# Patient Record
Sex: Male | Born: 1985 | Race: White | Hispanic: No | Marital: Single | Smoking: Never smoker
Health system: Southern US, Community
[De-identification: ages and names within clinical notes are randomized; demographics above are authoritative.]

---
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
## 2006-10-28 ENCOUNTER — Encounter: Admission: RE | Admit: 2006-10-28 | Discharge: 2006-10-28 | Payer: Self-pay | Admitting: Family Medicine

## 2008-10-07 IMAGING — CR DG CHEST 2V
2 series · 2 of 2 positions shown · non-contrast
Comparison: none

CLINICAL DATA: Cough.  Wheezing.  Short of breath.
 CHEST ? 2 VIEW:

[w chest pa]
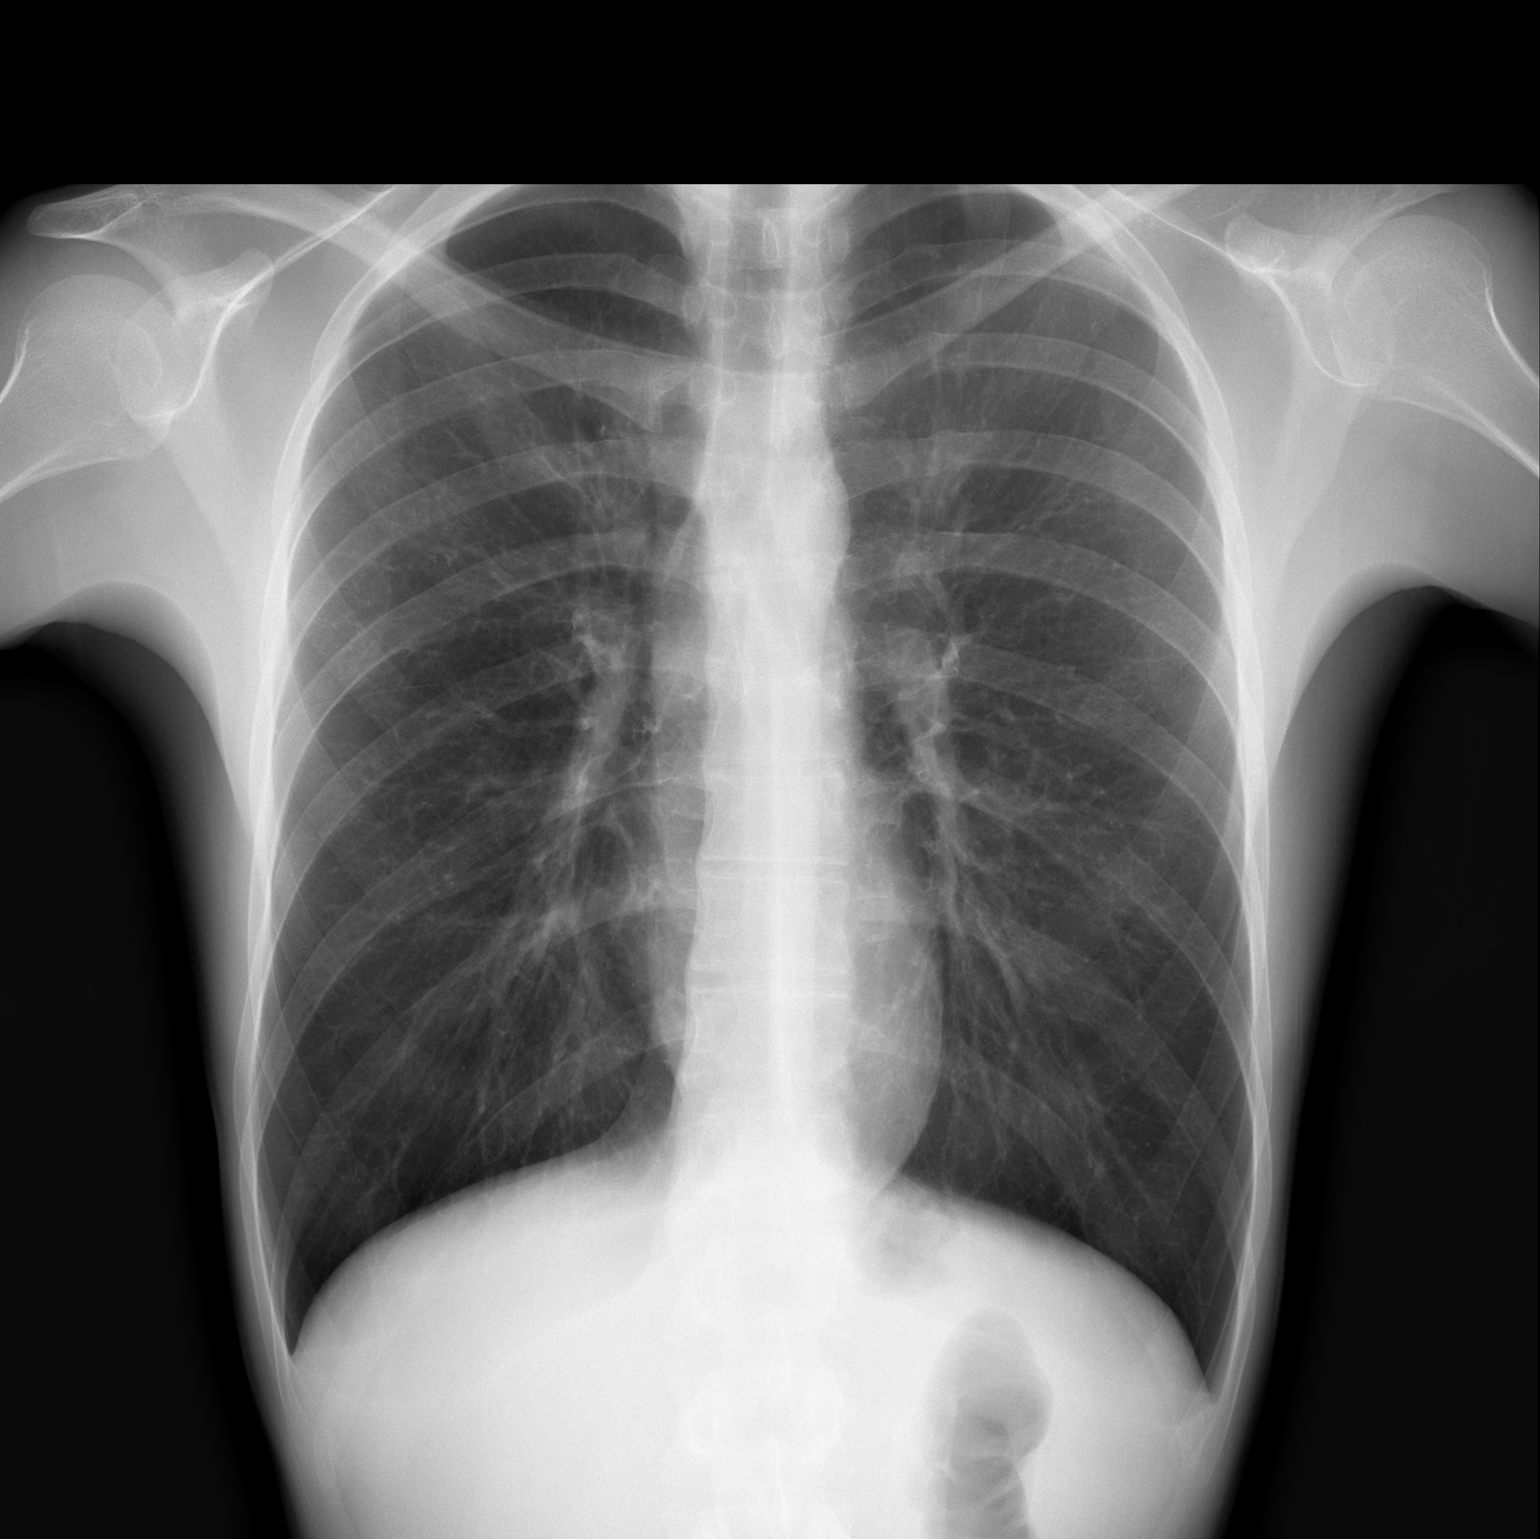

[w chest lat]
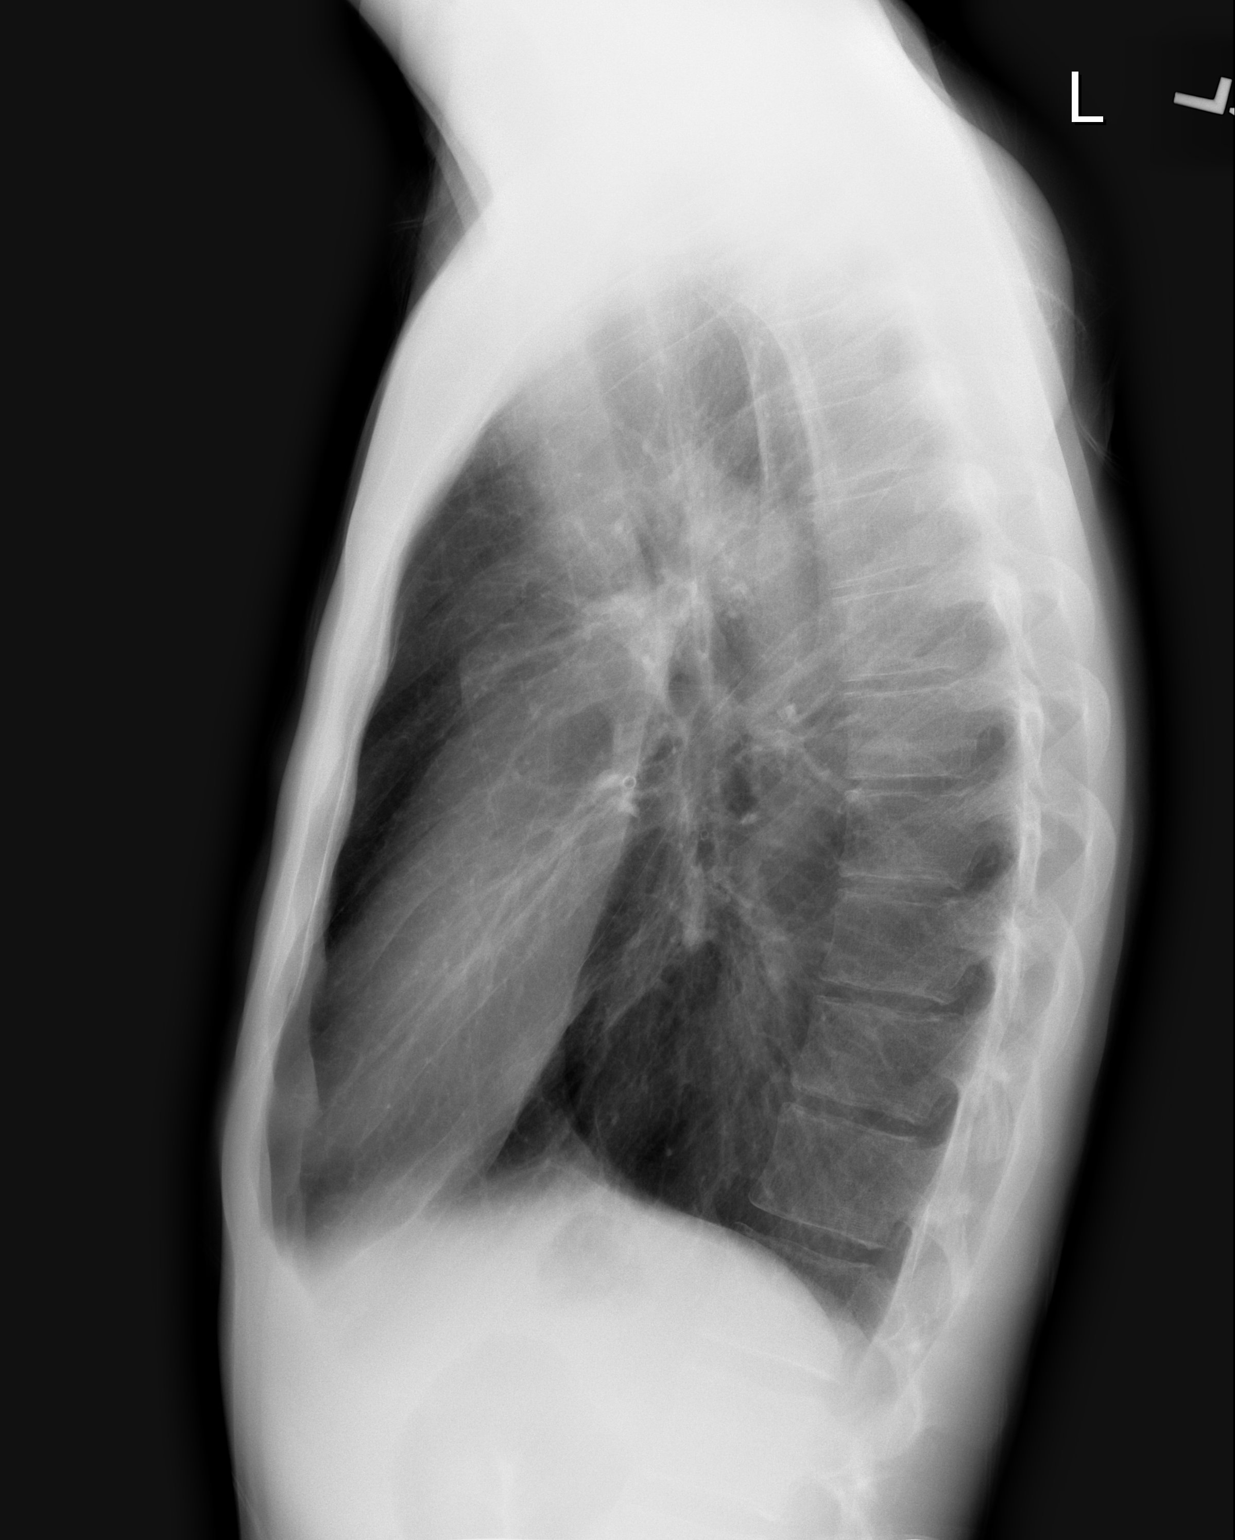

[2 of 2 positions shown; findings below may reference images not displayed]

FINDINGS: Two views of the chest show the lungs to be clear but hyperaerated.  There is peribronchial thickening which may indicate bronchitis.  The heart is within normal limits in size.
IMPRESSION: Peribronchial thickening consistent with bronchitis.  No pneumonia.  Slight hyperaeration.

## 2009-11-09 ENCOUNTER — Emergency Department (HOSPITAL_COMMUNITY): Admission: EM | Admit: 2009-11-09 | Discharge: 2009-11-09 | Payer: Self-pay | Admitting: Family Medicine

## 2010-12-14 LAB — POCT RAPID STREP A (OFFICE): Streptococcus, Group A Screen (Direct): NEGATIVE

## 2015-09-15 ENCOUNTER — Emergency Department (INDEPENDENT_AMBULATORY_CARE_PROVIDER_SITE_OTHER)
Admission: EM | Admit: 2015-09-15 | Discharge: 2015-09-15 | Disposition: A | Discharge status: Home / Self Care | Payer: BLUE CROSS/BLUE SHIELD | Source: Home / Self Care | Attending: Family Medicine | Admitting: Family Medicine

## 2015-09-15 ENCOUNTER — Encounter (HOSPITAL_COMMUNITY): Payer: Self-pay | Admitting: Emergency Medicine

## 2015-09-15 DIAGNOSIS — L03211 Cellulitis of face: Secondary | ICD-10-CM | POA: Diagnosis not present

## 2015-09-15 MED ORDER — DOXYCYCLINE HYCLATE 100 MG PO TABS: 100.0000 mg | ORAL_TABLET | Freq: Two times a day (BID) | ORAL | Status: DC

## 2015-09-15 NOTE — Discharge Instructions (Signed)
It was a pleasure to see you today.   As we discussed, skin infections of the face can progress rapidly and often require IV antibiotics in the hospital.  I am prescribing you an oral antibiotic, DOXYCYCLINE 100mg  tablets, take 1 tablet by mouth twice daily, beginning as soon as possible.   REPORT TO THE EMERGENCY ROOM IF YOU EXPERIENCE FEVERS/CHILLS, PAIN WITH MOVEMENT OF THE EYES, WORSENING OF SWELLING DESPITE TAKING THE ANTIBIOTICS, OR OTHER CONCERNS.   I am glad you have follow up scheduled for tomorrow morning at [facility].  It is imperative that you keep this follow-up appointment.

## 2015-09-15 NOTE — ED Provider Notes (Signed)
CSN: [id number]     Arrival date & time 09/15/15  1744 History   First MD Initiated Contact with Patient 09/15/15 1813     Chief Complaint  Patient presents with  . Cellulitis   (Consider location/radiation/quality/duration/timing/severity/associated sxs/prior Treatment) The history is provided by the patient. No language interpreter was used.  Pt here for complaint of swelling over the left eye, started 3 days ago when he tried to pop a pimple over the bridge of the nose. Soon thereafter noticed swelling along the bridge of the nose, extended along L upper eyelid. This morning was more swollen and difficulty opening the eye due to swelling. Of note, he has had no fevers/chills/sweats, not feeling ill overall.  He was seen at an urgent care on [facility] this morning around 10:30am and given a shot of antibiotic, Rx for Levaquin  daily.  He comes in to our [facility] today because it has not improved.   ROS: No cough, no shortness of breath.  Has history asthma but no recent flares. No ear pain, no sore throat.   History reviewed. No pertinent past medical history. History reviewed. No pertinent past surgical history. No family history on file. Social History  Substance Use Topics  . Smoking status: Never Smoker   . Smokeless tobacco: None  . Alcohol Use: Yes    Review of Systems  Constitutional: Negative for fever, chills, diaphoresis and fatigue.  HENT: Negative for congestion.   Respiratory: Negative for cough.   All other systems reviewed and are negative.   Allergies  Sulfa antibiotics  Home Medications   Prior to Admission medications   Medication Sig Start Date End Date Taking? Authorizing Provider  ALPRAZolam Prudy Feeler) 1 MG tablet Take 1 mg by mouth at bedtime as needed for anxiety.   Yes Historical Provider, MD  amLODipine (NORVASC) 10 MG tablet Take 10 mg by mouth daily.   Yes Historical Provider, MD  minocycline (DYNACIN) 100 MG tablet Take 100 mg by mouth 2  (two) times daily.   Yes Historical Provider, MD  pravastatin (PRAVACHOL) 20 MG tablet Take 20 mg by mouth daily.   Yes Historical Provider, MD  albuterol (PROVENTIL HFA;VENTOLIN HFA) 108 (90 BASE) MCG/ACT inhaler Inhale into the lungs every 6 (six) hours as needed for wheezing or shortness of breath.    Historical Provider, MD  doxycycline (VIBRA-TABS) 100 MG tablet Take 1 tablet (100 mg total) by mouth 2 (two) times daily. 09/15/15   Barbaraann Barthel, MD   Meds Ordered and Administered this Visit  Medications - No data to display  BP 152/91 mmHg  Pulse 79  Temp(Src) 98.5 F (36.9 C) (Oral)  SpO2 100% No data found.   Physical Exam  Constitutional: He appears well-developed and well-nourished. No distress.  HENT:  Right Ear: External ear normal.  Left Ear: External ear normal.  Mouth/Throat: Oropharynx is clear and moist. No oropharyngeal exudate.  Mild erythema over bridge of nose; marked swelling of upper eyelid on L eye. Able to open eyelid manually, normal tracking without pain. PERRL. EOMI.   Eyes: Conjunctivae and EOM are normal. Pupils are equal, round, and reactive to light.  Marked swelling and erythema of left eyelid. Able to track on EOM testing when L eyelid is held open by examiner.   PERRL.   Neck: Normal range of motion. Neck supple.  Cardiovascular: Normal rate and regular rhythm.   Pulmonary/Chest: Effort normal and breath sounds normal. No respiratory distress. He has no wheezes. He has  no rales. He exhibits no tenderness.  Lymphadenopathy:    He has no cervical adenopathy.  Skin: He is not diaphoretic.    ED Course  Procedures (including critical care time)  Labs Review Labs Reviewed - No data to display  Imaging Review No results found.   Visual Acuity Review  Right Eye Distance: unable to obtain due to swelling Left Eye Distance: 20/40 w/o corrections Bilateral Distance:    Right Eye Near:   Left Eye Near:    Bilateral Near:         MDM    1. Cellulitis of face    Patient with cellulitis of bridge of nose and L eyelid. No systemic signs of infection, not affecting EOM movement.  Discussion with patient of the seriousness of the infection, often indication for inpatient management with IV antibiotics.  Patient has follow up appointment with his primary doctor tomorrow [facility] at [facility] and plans to keep that appointment. Will give trial of Doxycycline 100mg  tablets po BID tonight with strict instructions to report to ED if any worsening or failure to improve by morning. He voices understanding of these instructions.     Barbaraann BarthelJames O Dontavian Marchi, MD 09/15/15 [phone]

## 2015-09-15 NOTE — ED Notes (Signed)
C/o cellulitis of left eye onset 2 days Reports he popped a pimple and swelling began afterwards Sx today include swelling, redness, and mild pain Unable to open left eye due to swelling A&O x4... No acute distress.

## 2015-09-16 ENCOUNTER — Telehealth: Payer: Self-pay | Admitting: Family Medicine

## 2015-09-16 NOTE — Telephone Encounter (Signed)
I called patient to inquire about his welfare, seen yesterday in [facility].  He reports no improvement or worsening of the redness/swelling over his left eye. He did get the doxycycline yesterday evening and has taken it.  I recommended that he proceed to the Emergency Department for evaluation and likely IV antibiotics.  He voiced understanding of this recommendation.   Paula ComptonJames Mahaley Schwering, MD

## 2016-01-17 ENCOUNTER — Emergency Department (HOSPITAL_COMMUNITY)
Admission: EM | Admit: 2016-01-17 | Discharge: 2016-01-17 | Disposition: A | Discharge status: Home / Self Care | Payer: BLUE CROSS/BLUE SHIELD | Attending: Emergency Medicine | Admitting: Emergency Medicine

## 2016-01-17 ENCOUNTER — Encounter (HOSPITAL_COMMUNITY): Payer: Self-pay | Admitting: *Deleted

## 2016-01-17 ENCOUNTER — Emergency Department (HOSPITAL_COMMUNITY): Payer: BLUE CROSS/BLUE SHIELD

## 2016-01-17 DIAGNOSIS — Z79899 Other long term (current) drug therapy: Secondary | ICD-10-CM | POA: Insufficient documentation

## 2016-01-17 DIAGNOSIS — Z792 Long term (current) use of antibiotics: Secondary | ICD-10-CM | POA: Insufficient documentation

## 2016-01-17 DIAGNOSIS — S92532A Displaced fracture of distal phalanx of left lesser toe(s), initial encounter for closed fracture: Secondary | ICD-10-CM | POA: Insufficient documentation

## 2016-01-17 DIAGNOSIS — W2209XA Striking against other stationary object, initial encounter: Secondary | ICD-10-CM | POA: Insufficient documentation

## 2016-01-17 DIAGNOSIS — S90122A Contusion of left lesser toe(s) without damage to nail, initial encounter: Secondary | ICD-10-CM | POA: Insufficient documentation

## 2016-01-17 DIAGNOSIS — Y9289 Other specified places as the place of occurrence of the external cause: Secondary | ICD-10-CM | POA: Insufficient documentation

## 2016-01-17 DIAGNOSIS — S92912A Unspecified fracture of left toe(s), initial encounter for closed fracture: Secondary | ICD-10-CM

## 2016-01-17 DIAGNOSIS — Y998 Other external cause status: Secondary | ICD-10-CM | POA: Diagnosis not present

## 2016-01-17 DIAGNOSIS — Y9302 Activity, running: Secondary | ICD-10-CM | POA: Diagnosis not present

## 2016-01-17 DIAGNOSIS — S92422A Displaced fracture of distal phalanx of left great toe, initial encounter for closed fracture: Secondary | ICD-10-CM | POA: Diagnosis not present

## 2016-01-17 DIAGNOSIS — S99922A Unspecified injury of left foot, initial encounter: Secondary | ICD-10-CM | POA: Diagnosis present

## 2016-01-17 MED ORDER — HYDROCODONE-ACETAMINOPHEN 5-325 MG PO TABS: 2.0000 | ORAL_TABLET | ORAL | Status: DC | PRN

## 2016-01-17 MED ORDER — IBUPROFEN 400 MG PO TABS
800.0000 mg | ORAL_TABLET | Freq: Once | ORAL | Status: AC
  Administered 2016-01-17: 800 mg via ORAL
  Filled 2016-01-17: qty 2

## 2016-01-17 MED ORDER — IBUPROFEN 800 MG PO TABS: 800.0000 mg | ORAL_TABLET | Freq: Three times a day (TID) | ORAL | Status: DC

## 2016-01-17 NOTE — ED Notes (Signed)
Ortho paged. 

## 2016-01-17 NOTE — ED Provider Notes (Signed)
CSN: [id number]     Arrival date & time 01/17/16  2147 History  By signing my name below, I, Iona BeardChristian Pulliam, attest that this documentation has been prepared under the direction and in the presence of Melburn HakeNicole Sheriece Jefcoat, PA-C  Electronically Signed: Iona Beardhristian Pulliam, ED Scribe 01/17/2016 at 11:16 PM.  Chief Complaint  Patient presents with  . Toe Injury    The history is provided by the patient. No language interpreter was used.   HPI Comments: Mathew Hunt is a 30 y.o. male who presents to the Emergency Department complaining of sudden onset, left 3rd toe pain, onset around 4:30 PM today after running his foot into a door. Pt reports associated ecchymosis, swelling, and mild left foot pain. No other associated symptoms noted. Pt took naproxen at home and applied ice with minimal relief to symptoms. No other worsening or alleviating factors noted. Pt denies numbness, tingling, weakness, or any other pertinent symptoms.   History reviewed. No pertinent past medical history. History reviewed. No pertinent past surgical history. No family history on file. Social History  Substance Use Topics  . Smoking status: Never Smoker   . Smokeless tobacco: None  . Alcohol Use: Yes    Review of Systems  Musculoskeletal: Positive for arthralgias.  Skin: Positive for color change.  Neurological: Negative for weakness and numbness.    Allergies  Sulfa antibiotics  Home Medications   Prior to Admission medications   Medication Sig Start Date End Date Taking? Authorizing Provider  albuterol (PROVENTIL HFA;VENTOLIN HFA) 108 (90 BASE) MCG/ACT inhaler Inhale into the lungs every 6 (six) hours as needed for wheezing or shortness of breath.   Yes Historical Provider, MD  ALPRAZolam Prudy Feeler(XANAX) 1 MG tablet Take 1 mg by mouth at bedtime as needed for anxiety.   Yes Historical Provider, MD  amLODipine (NORVASC) 10 MG tablet Take 10 mg by mouth daily.   Yes Historical Provider, MD  minocycline (DYNACIN)  100 MG tablet Take 100 mg by mouth 2 (two) times daily.   Yes Historical Provider, MD  pravastatin (PRAVACHOL) 20 MG tablet Take 20 mg by mouth daily.   Yes Historical Provider, MD  doxycycline (VIBRA-TABS) 100 MG tablet Take 1 tablet (100 mg total) by mouth 2 (two) times daily. Patient not taking: Reported on 01/17/2016 09/15/15   Barbaraann BarthelJames O Breen, MD  HYDROcodone-acetaminophen (NORCO/VICODIN) 5-325 MG tablet Take 2 tablets by mouth every 4 (four) hours as needed. 01/17/16   Barrett HenleNicole Elizabeth Sparrow Sanzo, PA-C  ibuprofen (ADVIL,MOTRIN) 800 MG tablet Take 1 tablet (800 mg total) by mouth 3 (three) times daily. 01/17/16   Satira SarkNicole Elizabeth Tarance Balan, PA-C   Pulse 81  Temp(Src) 98.1 F (36.7 C)  Resp 16  Ht 5\' 10"  (1.778 m)  Wt 163 lb 8 oz (74.163 kg)  BMI 23.46 kg/m2  SpO2 99% Physical Exam  Constitutional: He is oriented to person, place, and time. He appears well-developed and well-nourished.  HENT:  Head: Normocephalic and atraumatic.  Eyes: Conjunctivae and EOM are normal. Right eye exhibits no discharge. Left eye exhibits no discharge. No scleral icterus.  Neck: Normal range of motion. Neck supple.  Cardiovascular: Normal rate.   Pulmonary/Chest: Effort normal.  Abdominal: Soft. He exhibits no distension.  Musculoskeletal:  Mild swelling and ecchymosis to volar aspect of left 3rd toe at the distal and middle phalanx. No subungual hematoma. Full active ROM of left toes, foot, and ankle.  2+ DP pulses. Sensation grossly intact. Capillary refill less than 2 seconds.   Neurological: He  is alert and oriented to person, place, and time.  Skin: Skin is warm and dry.  Nursing note and vitals reviewed.   ED Course  Procedures (including critical care time) DIAGNOSTIC STUDIES: Oxygen Saturation is 99% on RA, normal by my interpretation.    COORDINATION OF CARE: 10:26 PM-Discussed treatment plan which includes DG toe 3rd left with pt at bedside and pt agreed to plan.   Labs Review Labs Reviewed -  No data to display  Imaging Review Dg Toe 3rd Left  01/17/2016  CLINICAL DATA:  Pain and swelling third tire were after toe obstructive door right ear earlier today. EXAM: LEFT THIRD TOE COMPARISON:  None. FINDINGS: Minimally displaced fracture distal phalanx extending to the distal interphalangeal joint. This is a chip fracture from the dorsal surface. Remainder of the third toe is intact. IMPRESSION: Minimally displaced intra-articular distal phalanx fracture. Electronically Signed   By: Rubye Oaks M.D.   On: 01/17/2016 22:26   I have personally reviewed and evaluated these images as part of my medical decision-making.   EKG Interpretation None      MDM   Final diagnoses:  Closed fracture of distal phalanx of toe of left foot   Patient presents with left third toe pain, swelling and bruising after stubbing his toe against a door prior to arrival. Mild relief of symptoms with naproxen and ice at home. VSS. Exam revealed mild swelling and ecchymosis to volar aspect of left third toe, no subungual hematoma. Left foot neurovascularly intact. Patient given ibuprofen and ice in the ED. Left third toe x-ray revealed minimally displaced intra-articular distal phalanx fracture. Buddy tape applied to patient's foot in the ED and patient given postop shoe. Discussed results and plan for discharge with patient. Patient discharged home with pain meds and discussed symptomatic treatment. Patient given orthopedic contact information for outpatient follow-up.  I personally performed the services described in this documentation, which was scribed in my presence. The recorded information has been reviewed and is accurate.   Satira Sark [place], [place] 01/17/16 2317  Pricilla Loveless, MD 01/19/16 [phone]

## 2016-01-17 NOTE — Discharge Instructions (Signed)
Take your medications as prescribed as needed for pain relief. I recommend eating prior to taking ibuprofen to prevent gastrointestinal side effects. Recommend continuing to rest, elevate and ice her toe for 15-20 minutes 3-4 times daily to help with pain and swelling. Follow-up with the orthopedic office listed above in the next 1-2 weeks. Return to the emergency department if symptoms worsen or new onset of fever, worsening swelling/bruising, numbness, tingling, weakness, redness, warmth, drainage.

## 2016-01-17 NOTE — ED Notes (Signed)
Patient able to ambulate independently  

## 2016-01-17 NOTE — ED Notes (Signed)
Pt went to xray.  Will be returned to room.

## 2016-01-17 NOTE — ED Notes (Signed)
The pt is c/o rt middle toe pain after he ran into an object earlier today  Bruised and swollen and painful

## 2016-01-23 DIAGNOSIS — S92532A Displaced fracture of distal phalanx of left lesser toe(s), initial encounter for closed fracture: Secondary | ICD-10-CM | POA: Diagnosis not present

## 2016-04-27 DIAGNOSIS — I10 Essential (primary) hypertension: Secondary | ICD-10-CM | POA: Diagnosis not present

## 2016-04-27 DIAGNOSIS — E785 Hyperlipidemia, unspecified: Secondary | ICD-10-CM | POA: Diagnosis not present

## 2016-04-27 DIAGNOSIS — J45909 Unspecified asthma, uncomplicated: Secondary | ICD-10-CM | POA: Diagnosis not present

## 2016-04-27 DIAGNOSIS — F410 Panic disorder [episodic paroxysmal anxiety] without agoraphobia: Secondary | ICD-10-CM | POA: Diagnosis not present

## 2016-10-18 DIAGNOSIS — Z23 Encounter for immunization: Secondary | ICD-10-CM | POA: Diagnosis not present

## 2016-10-29 DIAGNOSIS — Z0000 Encounter for general adult medical examination without abnormal findings: Secondary | ICD-10-CM | POA: Diagnosis not present

## 2016-11-02 DIAGNOSIS — E785 Hyperlipidemia, unspecified: Secondary | ICD-10-CM | POA: Diagnosis not present

## 2017-01-21 DIAGNOSIS — Z3009 Encounter for other general counseling and advice on contraception: Secondary | ICD-10-CM | POA: Diagnosis not present

## 2017-03-19 DIAGNOSIS — Z3009 Encounter for other general counseling and advice on contraception: Secondary | ICD-10-CM | POA: Diagnosis not present

## 2017-04-15 DIAGNOSIS — Z302 Encounter for sterilization: Secondary | ICD-10-CM | POA: Diagnosis not present

## 2017-04-16 DIAGNOSIS — N50811 Right testicular pain: Secondary | ICD-10-CM | POA: Diagnosis not present

## 2017-04-22 DIAGNOSIS — N50811 Right testicular pain: Secondary | ICD-10-CM | POA: Diagnosis not present

## 2017-05-06 DIAGNOSIS — T148XXA Other injury of unspecified body region, initial encounter: Secondary | ICD-10-CM | POA: Diagnosis not present

## 2017-07-29 DIAGNOSIS — Z23 Encounter for immunization: Secondary | ICD-10-CM | POA: Diagnosis not present

## 2017-11-15 DIAGNOSIS — Z7189 Other specified counseling: Secondary | ICD-10-CM | POA: Diagnosis not present

## 2017-11-15 DIAGNOSIS — J45909 Unspecified asthma, uncomplicated: Secondary | ICD-10-CM | POA: Diagnosis not present

## 2017-11-15 DIAGNOSIS — Z23 Encounter for immunization: Secondary | ICD-10-CM | POA: Diagnosis not present

## 2017-12-27 IMAGING — DX DG TOE 3RD 2+V*L*
3 series · 3 of 3 positions shown · non-contrast
Comparison: None.

CLINICAL DATA: Pain and swelling third tire were after toe
obstructive door right ear earlier today.

EXAM:
LEFT THIRD TOE

[toe ap]
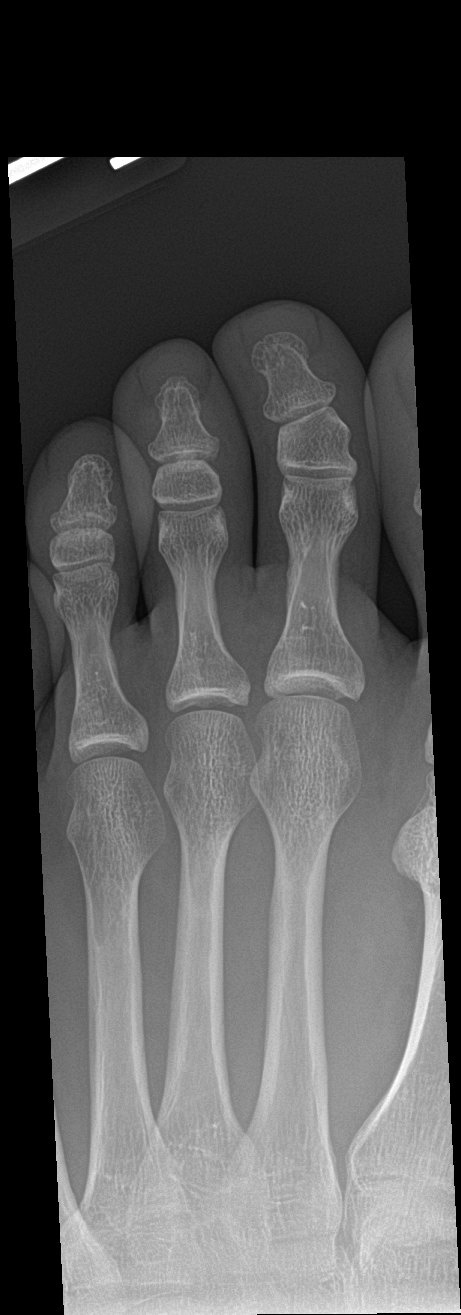

[toe obl]
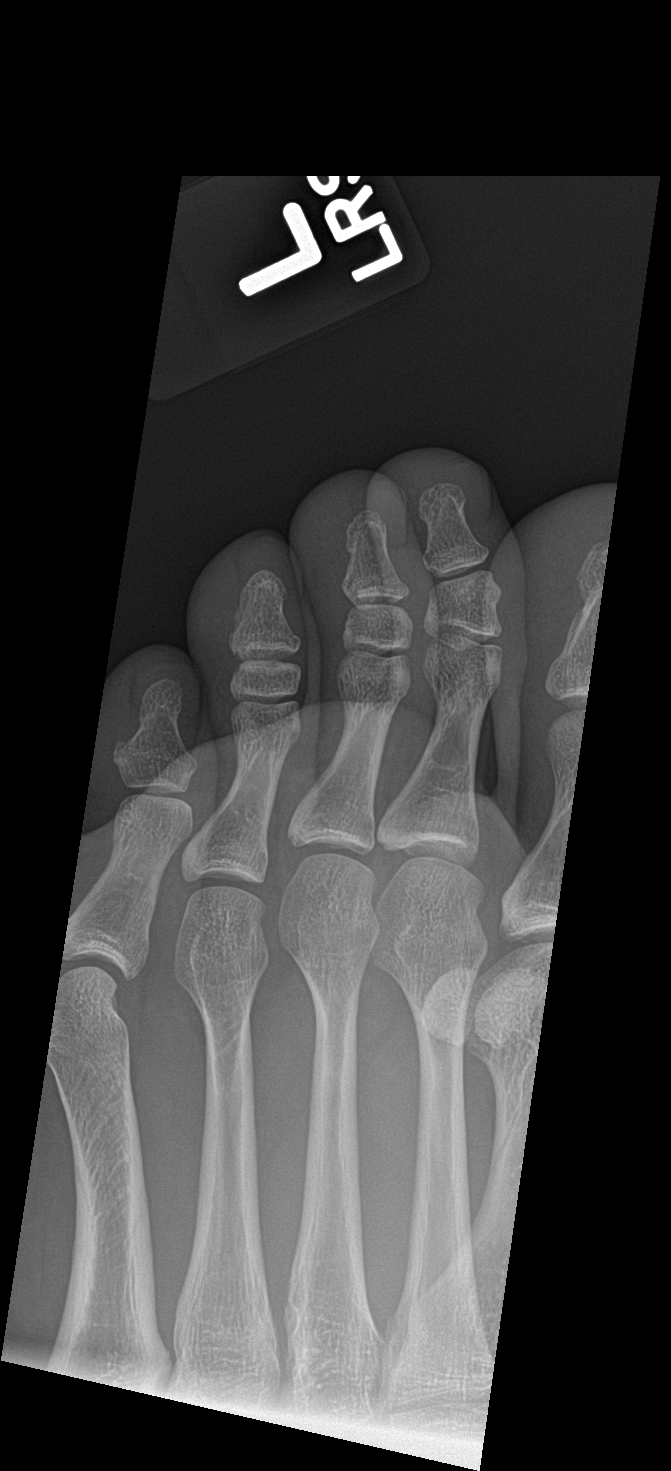

[toe lat]
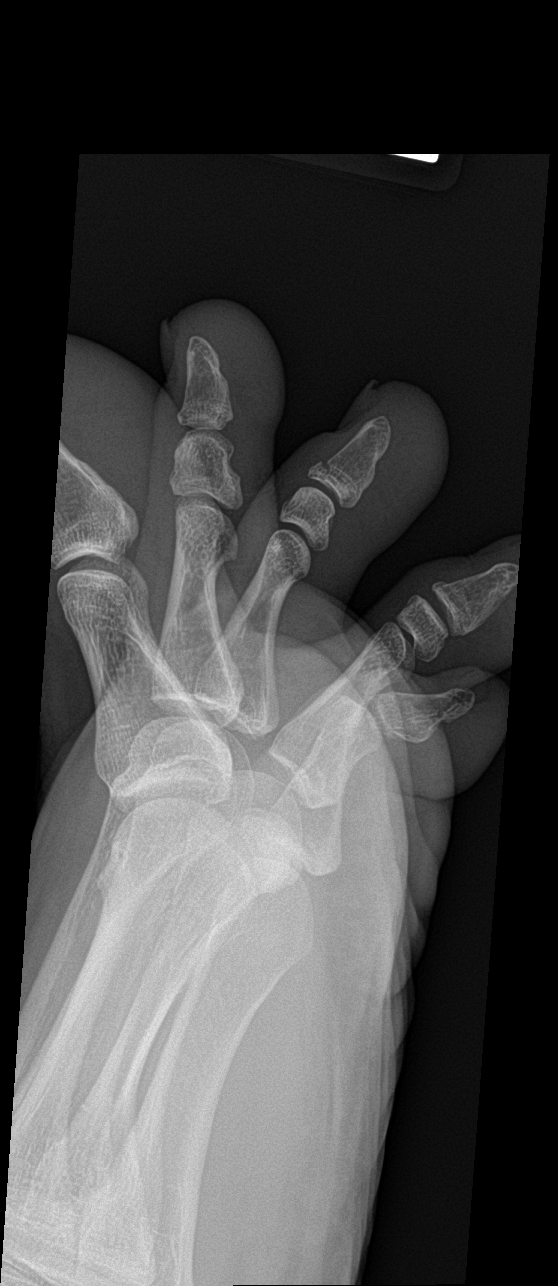

[3 of 3 positions shown; findings below may reference images not displayed]

FINDINGS: Minimally displaced fracture distal phalanx extending to the distal
interphalangeal joint. This is a chip fracture from the dorsal
surface. Remainder of the third toe is intact.
IMPRESSION: Minimally displaced intra-articular distal phalanx fracture.

## 2018-05-19 DIAGNOSIS — Z23 Encounter for immunization: Secondary | ICD-10-CM | POA: Diagnosis not present

## 2018-07-13 DIAGNOSIS — L03116 Cellulitis of left lower limb: Secondary | ICD-10-CM | POA: Diagnosis not present

## 2018-07-15 DIAGNOSIS — Z23 Encounter for immunization: Secondary | ICD-10-CM | POA: Diagnosis not present

## 2018-07-15 DIAGNOSIS — E785 Hyperlipidemia, unspecified: Secondary | ICD-10-CM | POA: Diagnosis not present

## 2018-07-15 DIAGNOSIS — I10 Essential (primary) hypertension: Secondary | ICD-10-CM | POA: Diagnosis not present

## 2018-08-29 DIAGNOSIS — I10 Essential (primary) hypertension: Secondary | ICD-10-CM | POA: Diagnosis not present

## 2018-08-29 DIAGNOSIS — R600 Localized edema: Secondary | ICD-10-CM | POA: Diagnosis not present

## 2018-09-12 DIAGNOSIS — I10 Essential (primary) hypertension: Secondary | ICD-10-CM | POA: Diagnosis not present

## 2018-11-19 DIAGNOSIS — J111 Influenza due to unidentified influenza virus with other respiratory manifestations: Secondary | ICD-10-CM | POA: Diagnosis not present

## 2019-01-14 DIAGNOSIS — Z0000 Encounter for general adult medical examination without abnormal findings: Secondary | ICD-10-CM | POA: Diagnosis not present

## 2019-01-23 DIAGNOSIS — F331 Major depressive disorder, recurrent, moderate: Secondary | ICD-10-CM | POA: Diagnosis not present

## 2019-05-04 ENCOUNTER — Other Ambulatory Visit: Payer: Self-pay

## 2019-05-04 DIAGNOSIS — Z20822 Contact with and (suspected) exposure to covid-19: Secondary | ICD-10-CM

## 2019-05-05 LAB — NOVEL CORONAVIRUS, NAA: SARS-CoV-2, NAA: NOT DETECTED

## 2019-05-11 ENCOUNTER — Other Ambulatory Visit: Payer: Self-pay

## 2019-05-11 DIAGNOSIS — Z20822 Contact with and (suspected) exposure to covid-19: Secondary | ICD-10-CM

## 2019-05-13 LAB — NOVEL CORONAVIRUS, NAA: SARS-CoV-2, NAA: NOT DETECTED

## 2019-07-16 ENCOUNTER — Other Ambulatory Visit: Payer: Self-pay

## 2019-07-16 DIAGNOSIS — Z20822 Contact with and (suspected) exposure to covid-19: Secondary | ICD-10-CM

## 2019-07-18 LAB — NOVEL CORONAVIRUS, NAA: SARS-CoV-2, NAA: NOT DETECTED

## 2019-07-20 ENCOUNTER — Other Ambulatory Visit: Payer: Self-pay

## 2019-07-20 DIAGNOSIS — Z20822 Contact with and (suspected) exposure to covid-19: Secondary | ICD-10-CM

## 2019-07-21 LAB — NOVEL CORONAVIRUS, NAA: SARS-CoV-2, NAA: NOT DETECTED

## 2019-07-27 ENCOUNTER — Other Ambulatory Visit: Payer: Self-pay

## 2019-07-27 DIAGNOSIS — Z20822 Contact with and (suspected) exposure to covid-19: Secondary | ICD-10-CM

## 2019-07-27 DIAGNOSIS — J019 Acute sinusitis, unspecified: Secondary | ICD-10-CM | POA: Diagnosis not present

## 2019-07-28 LAB — NOVEL CORONAVIRUS, NAA: SARS-CoV-2, NAA: DETECTED — AB

## 2019-08-26 DIAGNOSIS — F410 Panic disorder [episodic paroxysmal anxiety] without agoraphobia: Secondary | ICD-10-CM | POA: Diagnosis not present

## 2019-08-26 DIAGNOSIS — E785 Hyperlipidemia, unspecified: Secondary | ICD-10-CM | POA: Diagnosis not present

## 2019-08-26 DIAGNOSIS — I10 Essential (primary) hypertension: Secondary | ICD-10-CM | POA: Diagnosis not present

## 2020-09-08 DIAGNOSIS — F331 Major depressive disorder, recurrent, moderate: Secondary | ICD-10-CM | POA: Diagnosis not present

## 2020-09-08 DIAGNOSIS — J45909 Unspecified asthma, uncomplicated: Secondary | ICD-10-CM | POA: Diagnosis not present

## 2021-07-25 DIAGNOSIS — U071 COVID-19: Secondary | ICD-10-CM | POA: Diagnosis not present

## 2021-09-22 DIAGNOSIS — F410 Panic disorder [episodic paroxysmal anxiety] without agoraphobia: Secondary | ICD-10-CM | POA: Diagnosis not present

## 2021-09-22 DIAGNOSIS — Z23 Encounter for immunization: Secondary | ICD-10-CM | POA: Diagnosis not present

## 2021-09-22 DIAGNOSIS — L709 Acne, unspecified: Secondary | ICD-10-CM | POA: Diagnosis not present

## 2021-09-22 DIAGNOSIS — I10 Essential (primary) hypertension: Secondary | ICD-10-CM | POA: Diagnosis not present

## 2021-09-22 DIAGNOSIS — J45909 Unspecified asthma, uncomplicated: Secondary | ICD-10-CM | POA: Diagnosis not present

## 2021-10-06 DIAGNOSIS — I10 Essential (primary) hypertension: Secondary | ICD-10-CM | POA: Diagnosis not present

## 2022-04-05 DIAGNOSIS — I10 Essential (primary) hypertension: Secondary | ICD-10-CM | POA: Diagnosis not present

## 2022-04-05 DIAGNOSIS — J45909 Unspecified asthma, uncomplicated: Secondary | ICD-10-CM | POA: Diagnosis not present

## 2022-04-05 DIAGNOSIS — E785 Hyperlipidemia, unspecified: Secondary | ICD-10-CM | POA: Diagnosis not present

## 2022-06-07 DIAGNOSIS — E7849 Other hyperlipidemia: Secondary | ICD-10-CM | POA: Diagnosis not present

## 2022-06-15 DIAGNOSIS — F321 Major depressive disorder, single episode, moderate: Secondary | ICD-10-CM | POA: Diagnosis not present

## 2022-06-19 DIAGNOSIS — F321 Major depressive disorder, single episode, moderate: Secondary | ICD-10-CM | POA: Diagnosis not present

## 2022-06-26 DIAGNOSIS — F321 Major depressive disorder, single episode, moderate: Secondary | ICD-10-CM | POA: Diagnosis not present

## 2022-07-02 DIAGNOSIS — F321 Major depressive disorder, single episode, moderate: Secondary | ICD-10-CM | POA: Diagnosis not present

## 2022-07-10 DIAGNOSIS — F321 Major depressive disorder, single episode, moderate: Secondary | ICD-10-CM | POA: Diagnosis not present

## 2022-07-17 DIAGNOSIS — F321 Major depressive disorder, single episode, moderate: Secondary | ICD-10-CM | POA: Diagnosis not present

## 2022-07-31 DIAGNOSIS — F321 Major depressive disorder, single episode, moderate: Secondary | ICD-10-CM | POA: Diagnosis not present

## 2022-08-07 DIAGNOSIS — F321 Major depressive disorder, single episode, moderate: Secondary | ICD-10-CM | POA: Diagnosis not present

## 2022-08-14 DIAGNOSIS — F321 Major depressive disorder, single episode, moderate: Secondary | ICD-10-CM | POA: Diagnosis not present

## 2022-08-21 DIAGNOSIS — F321 Major depressive disorder, single episode, moderate: Secondary | ICD-10-CM | POA: Diagnosis not present

## 2022-09-11 DIAGNOSIS — F321 Major depressive disorder, single episode, moderate: Secondary | ICD-10-CM | POA: Diagnosis not present

## 2022-09-27 DIAGNOSIS — F321 Major depressive disorder, single episode, moderate: Secondary | ICD-10-CM | POA: Diagnosis not present

## 2022-10-04 DIAGNOSIS — I10 Essential (primary) hypertension: Secondary | ICD-10-CM | POA: Diagnosis not present

## 2022-10-04 DIAGNOSIS — Z23 Encounter for immunization: Secondary | ICD-10-CM | POA: Diagnosis not present

## 2022-10-04 DIAGNOSIS — Z0000 Encounter for general adult medical examination without abnormal findings: Secondary | ICD-10-CM | POA: Diagnosis not present

## 2022-10-04 DIAGNOSIS — E785 Hyperlipidemia, unspecified: Secondary | ICD-10-CM | POA: Diagnosis not present

## 2022-10-16 DIAGNOSIS — F321 Major depressive disorder, single episode, moderate: Secondary | ICD-10-CM | POA: Diagnosis not present

## 2022-10-25 DIAGNOSIS — F321 Major depressive disorder, single episode, moderate: Secondary | ICD-10-CM | POA: Diagnosis not present

## 2022-11-06 DIAGNOSIS — F321 Major depressive disorder, single episode, moderate: Secondary | ICD-10-CM | POA: Diagnosis not present

## 2022-11-07 DIAGNOSIS — R17 Unspecified jaundice: Secondary | ICD-10-CM | POA: Diagnosis not present

## 2022-11-07 DIAGNOSIS — I10 Essential (primary) hypertension: Secondary | ICD-10-CM | POA: Diagnosis not present

## 2022-11-13 DIAGNOSIS — F321 Major depressive disorder, single episode, moderate: Secondary | ICD-10-CM | POA: Diagnosis not present

## 2022-11-29 DIAGNOSIS — F321 Major depressive disorder, single episode, moderate: Secondary | ICD-10-CM | POA: Diagnosis not present

## 2022-12-05 DIAGNOSIS — F321 Major depressive disorder, single episode, moderate: Secondary | ICD-10-CM | POA: Diagnosis not present

## 2022-12-14 DIAGNOSIS — F321 Major depressive disorder, single episode, moderate: Secondary | ICD-10-CM | POA: Diagnosis not present

## 2022-12-20 ENCOUNTER — Encounter: Payer: Self-pay | Admitting: Podiatry

## 2022-12-20 ENCOUNTER — Ambulatory Visit: Payer: BC Managed Care – PPO | Admitting: Podiatry

## 2022-12-20 ENCOUNTER — Ambulatory Visit (INDEPENDENT_AMBULATORY_CARE_PROVIDER_SITE_OTHER): Payer: BC Managed Care – PPO

## 2022-12-20 DIAGNOSIS — Q828 Other specified congenital malformations of skin: Secondary | ICD-10-CM | POA: Diagnosis not present

## 2022-12-20 DIAGNOSIS — S90211A Contusion of right great toe with damage to nail, initial encounter: Secondary | ICD-10-CM

## 2022-12-20 DIAGNOSIS — M7752 Other enthesopathy of left foot: Secondary | ICD-10-CM

## 2022-12-20 MED ORDER — UREA 10 % EX CREA: TOPICAL_CREAM | CUTANEOUS | 0 refills | Status: AC | PRN

## 2022-12-20 NOTE — Progress Notes (Signed)
  Subjective:  Patient ID: Mathew Hunt, male    DOB: January 14, 1986,  MRN: [id number]  Chief Complaint  Patient presents with   Foot Pain    5th MPJ left - aching, callused x 6 month+, tender with walking and shoes, tried OTC insoles-some help   Toe Injury    Hallux right - stumped toe in January moving, bruised the nail, very tender at times, no treatment   New Patient (Initial Visit)    37 y.o. male presents with concern for painful lesion on the outside of his left fifth metatarsal phalangeal joint.  He says it feels like he is walking on a pebble at that location.  He also notes that he had prior injury to the right hallux in January.  Had some bleeding underneath the nail did not have treatment at the time but is not significantly painful at this time.  No past medical history on file.  Allergies  Allergen Reactions   Sulfa Antibiotics     Hives     ROS: Negative except as per HPI above  Objective:  General: AAO x3, NAD  Dermatological: Hyperkeratotic lesion with central hyperkeratotic core noted of the plantar lateral aspect of the fifth metatarsophalangeal joint on the left foot.  There is pain on palpation of this area.  Consistent with porokeratosis.  Vascular:  Dorsalis Pedis artery and Posterior Tibial artery pedal pulses are 2/4 bilateral.  Capillary fill time < 3 sec to all digits.   Neruologic: Grossly intact via light touch bilateral. Protective threshold intact to all sites bilateral.   Musculoskeletal: No gross boney pedal deformities bilateral. No pain, crepitus, or limitation noted with foot and ankle range of motion bilateral. Muscular strength 5/5 in all groups tested bilateral.  Gait: Unassisted, Nonantalgic.   No images are attached to the encounter.  Radiographs:  Date: 12/12/2022 XR the left foot Weightbearing AP/Lateral/Oblique   Findings: no fracture, dislocation, swelling or degenerative changes noted Assessment:   1. Porokeratosis   2.  Bursitis of left foot   3. Contusion of right great toe with damage to nail, initial encounter      Plan:  Patient was evaluated and treated and all questions answered.  # Porokeratosis left foot All symptomatic hyperkeratoses x 1 were safely debrided with a sterile #15 blade to patient's level of comfort without incident. We discussed preventative and palliative care of these lesions including supportive and accommodative shoegear, padding, prefabricated and custom molded accommodative orthoses, use of a pumice stone and lotions/creams daily.  # Prior nail trauma to the right hallux nail with subungual hematoma -Will continue to monitor as patient says the nail is not overly painful to palpation at this time do not recommend avulsion at this time.   Return if symptoms worsen or fail to improve.          Everitt Amber, DPM [facility]

## 2022-12-25 DIAGNOSIS — F321 Major depressive disorder, single episode, moderate: Secondary | ICD-10-CM | POA: Diagnosis not present

## 2023-01-01 DIAGNOSIS — F321 Major depressive disorder, single episode, moderate: Secondary | ICD-10-CM | POA: Diagnosis not present

## 2023-01-09 DIAGNOSIS — F321 Major depressive disorder, single episode, moderate: Secondary | ICD-10-CM | POA: Diagnosis not present

## 2023-01-15 DIAGNOSIS — F321 Major depressive disorder, single episode, moderate: Secondary | ICD-10-CM | POA: Diagnosis not present

## 2023-01-22 DIAGNOSIS — F321 Major depressive disorder, single episode, moderate: Secondary | ICD-10-CM | POA: Diagnosis not present

## 2023-02-01 DIAGNOSIS — F321 Major depressive disorder, single episode, moderate: Secondary | ICD-10-CM | POA: Diagnosis not present

## 2023-02-12 DIAGNOSIS — F321 Major depressive disorder, single episode, moderate: Secondary | ICD-10-CM | POA: Diagnosis not present

## 2023-02-21 DIAGNOSIS — F321 Major depressive disorder, single episode, moderate: Secondary | ICD-10-CM | POA: Diagnosis not present

## 2023-03-07 DIAGNOSIS — F321 Major depressive disorder, single episode, moderate: Secondary | ICD-10-CM | POA: Diagnosis not present

## 2023-03-14 DIAGNOSIS — F321 Major depressive disorder, single episode, moderate: Secondary | ICD-10-CM | POA: Diagnosis not present

## 2023-03-19 DIAGNOSIS — F321 Major depressive disorder, single episode, moderate: Secondary | ICD-10-CM | POA: Diagnosis not present

## 2023-04-02 DIAGNOSIS — F321 Major depressive disorder, single episode, moderate: Secondary | ICD-10-CM | POA: Diagnosis not present

## 2023-04-08 ENCOUNTER — Other Ambulatory Visit (HOSPITAL_BASED_OUTPATIENT_CLINIC_OR_DEPARTMENT_OTHER): Payer: Self-pay | Admitting: Internal Medicine

## 2023-04-08 DIAGNOSIS — E785 Hyperlipidemia, unspecified: Secondary | ICD-10-CM | POA: Diagnosis not present

## 2023-04-08 DIAGNOSIS — Z789 Other specified health status: Secondary | ICD-10-CM | POA: Diagnosis not present

## 2023-04-08 DIAGNOSIS — E7849 Other hyperlipidemia: Secondary | ICD-10-CM | POA: Diagnosis not present

## 2023-04-08 DIAGNOSIS — R0789 Other chest pain: Secondary | ICD-10-CM

## 2023-04-08 DIAGNOSIS — F410 Panic disorder [episodic paroxysmal anxiety] without agoraphobia: Secondary | ICD-10-CM | POA: Diagnosis not present

## 2023-04-08 DIAGNOSIS — I10 Essential (primary) hypertension: Secondary | ICD-10-CM | POA: Diagnosis not present

## 2023-04-08 DIAGNOSIS — R002 Palpitations: Secondary | ICD-10-CM

## 2023-04-16 ENCOUNTER — Ambulatory Visit (HOSPITAL_COMMUNITY)
Admission: RE | Admit: 2023-04-16 | Discharge: 2023-04-16 | Disposition: A | Discharge status: Home / Self Care | Payer: BLUE CROSS/BLUE SHIELD | Source: Ambulatory Visit | Attending: Internal Medicine | Admitting: Internal Medicine

## 2023-04-16 DIAGNOSIS — R002 Palpitations: Secondary | ICD-10-CM | POA: Insufficient documentation

## 2023-04-16 DIAGNOSIS — I10 Essential (primary) hypertension: Secondary | ICD-10-CM | POA: Insufficient documentation

## 2023-04-16 DIAGNOSIS — R0789 Other chest pain: Secondary | ICD-10-CM | POA: Insufficient documentation

## 2023-04-19 DIAGNOSIS — R0789 Other chest pain: Secondary | ICD-10-CM | POA: Diagnosis not present

## 2023-04-19 DIAGNOSIS — R002 Palpitations: Secondary | ICD-10-CM | POA: Diagnosis not present

## 2023-04-23 DIAGNOSIS — F321 Major depressive disorder, single episode, moderate: Secondary | ICD-10-CM | POA: Diagnosis not present

## 2023-05-06 DIAGNOSIS — F321 Major depressive disorder, single episode, moderate: Secondary | ICD-10-CM | POA: Diagnosis not present

## 2023-05-14 DIAGNOSIS — F321 Major depressive disorder, single episode, moderate: Secondary | ICD-10-CM | POA: Diagnosis not present

## 2023-05-21 DIAGNOSIS — F321 Major depressive disorder, single episode, moderate: Secondary | ICD-10-CM | POA: Diagnosis not present

## 2023-05-30 DIAGNOSIS — F321 Major depressive disorder, single episode, moderate: Secondary | ICD-10-CM | POA: Diagnosis not present

## 2023-06-06 DIAGNOSIS — F321 Major depressive disorder, single episode, moderate: Secondary | ICD-10-CM | POA: Diagnosis not present

## 2023-06-17 DIAGNOSIS — F321 Major depressive disorder, single episode, moderate: Secondary | ICD-10-CM | POA: Diagnosis not present

## 2023-07-08 DIAGNOSIS — F321 Major depressive disorder, single episode, moderate: Secondary | ICD-10-CM | POA: Diagnosis not present

## 2023-07-16 DIAGNOSIS — F321 Major depressive disorder, single episode, moderate: Secondary | ICD-10-CM | POA: Diagnosis not present

## 2023-08-19 DIAGNOSIS — D229 Melanocytic nevi, unspecified: Secondary | ICD-10-CM | POA: Diagnosis not present

## 2023-08-19 DIAGNOSIS — L814 Other melanin hyperpigmentation: Secondary | ICD-10-CM | POA: Diagnosis not present

## 2023-08-19 DIAGNOSIS — L821 Other seborrheic keratosis: Secondary | ICD-10-CM | POA: Diagnosis not present

## 2023-08-19 DIAGNOSIS — L578 Other skin changes due to chronic exposure to nonionizing radiation: Secondary | ICD-10-CM | POA: Diagnosis not present

## 2023-08-27 DIAGNOSIS — F321 Major depressive disorder, single episode, moderate: Secondary | ICD-10-CM | POA: Diagnosis not present

## 2023-09-12 DIAGNOSIS — F321 Major depressive disorder, single episode, moderate: Secondary | ICD-10-CM | POA: Diagnosis not present

## 2023-10-10 DIAGNOSIS — Z0000 Encounter for general adult medical examination without abnormal findings: Secondary | ICD-10-CM | POA: Diagnosis not present

## 2023-10-10 DIAGNOSIS — E785 Hyperlipidemia, unspecified: Secondary | ICD-10-CM | POA: Diagnosis not present

## 2023-10-10 DIAGNOSIS — Z23 Encounter for immunization: Secondary | ICD-10-CM | POA: Diagnosis not present

## 2023-10-10 DIAGNOSIS — F331 Major depressive disorder, recurrent, moderate: Secondary | ICD-10-CM | POA: Diagnosis not present

## 2023-10-10 DIAGNOSIS — I10 Essential (primary) hypertension: Secondary | ICD-10-CM | POA: Diagnosis not present

## 2023-10-24 DIAGNOSIS — F321 Major depressive disorder, single episode, moderate: Secondary | ICD-10-CM | POA: Diagnosis not present

## 2023-11-12 DIAGNOSIS — F321 Major depressive disorder, single episode, moderate: Secondary | ICD-10-CM | POA: Diagnosis not present

## 2023-12-02 DIAGNOSIS — F321 Major depressive disorder, single episode, moderate: Secondary | ICD-10-CM | POA: Diagnosis not present

## 2024-01-07 DIAGNOSIS — F321 Major depressive disorder, single episode, moderate: Secondary | ICD-10-CM | POA: Diagnosis not present

## 2024-01-23 DIAGNOSIS — F321 Major depressive disorder, single episode, moderate: Secondary | ICD-10-CM | POA: Diagnosis not present

## 2024-01-28 DIAGNOSIS — F321 Major depressive disorder, single episode, moderate: Secondary | ICD-10-CM | POA: Diagnosis not present

## 2024-02-20 DIAGNOSIS — F321 Major depressive disorder, single episode, moderate: Secondary | ICD-10-CM | POA: Diagnosis not present

## 2024-03-13 DIAGNOSIS — F321 Major depressive disorder, single episode, moderate: Secondary | ICD-10-CM | POA: Diagnosis not present

## 2024-03-30 DIAGNOSIS — F321 Major depressive disorder, single episode, moderate: Secondary | ICD-10-CM | POA: Diagnosis not present

## 2024-04-09 DIAGNOSIS — E7849 Other hyperlipidemia: Secondary | ICD-10-CM | POA: Diagnosis not present

## 2024-04-09 DIAGNOSIS — F410 Panic disorder [episodic paroxysmal anxiety] without agoraphobia: Secondary | ICD-10-CM | POA: Diagnosis not present

## 2024-04-09 DIAGNOSIS — E785 Hyperlipidemia, unspecified: Secondary | ICD-10-CM | POA: Diagnosis not present

## 2024-04-09 DIAGNOSIS — I10 Essential (primary) hypertension: Secondary | ICD-10-CM | POA: Diagnosis not present

## 2024-04-27 DIAGNOSIS — I10 Essential (primary) hypertension: Secondary | ICD-10-CM | POA: Diagnosis not present

## 2024-04-27 DIAGNOSIS — F331 Major depressive disorder, recurrent, moderate: Secondary | ICD-10-CM | POA: Diagnosis not present

## 2024-04-27 DIAGNOSIS — R0683 Snoring: Secondary | ICD-10-CM | POA: Diagnosis not present

## 2024-04-27 DIAGNOSIS — J45909 Unspecified asthma, uncomplicated: Secondary | ICD-10-CM | POA: Diagnosis not present

## 2024-05-08 DIAGNOSIS — F321 Major depressive disorder, single episode, moderate: Secondary | ICD-10-CM | POA: Diagnosis not present

## 2024-05-15 DIAGNOSIS — F321 Major depressive disorder, single episode, moderate: Secondary | ICD-10-CM | POA: Diagnosis not present

## 2024-06-17 DIAGNOSIS — G4733 Obstructive sleep apnea (adult) (pediatric): Secondary | ICD-10-CM | POA: Diagnosis not present

## 2024-06-19 DIAGNOSIS — F321 Major depressive disorder, single episode, moderate: Secondary | ICD-10-CM | POA: Diagnosis not present

## 2024-07-02 DIAGNOSIS — I10 Essential (primary) hypertension: Secondary | ICD-10-CM | POA: Diagnosis not present

## 2024-07-02 DIAGNOSIS — R0689 Other abnormalities of breathing: Secondary | ICD-10-CM | POA: Diagnosis not present

## 2024-07-02 DIAGNOSIS — G4733 Obstructive sleep apnea (adult) (pediatric): Secondary | ICD-10-CM | POA: Diagnosis not present

## 2024-07-08 DIAGNOSIS — G4733 Obstructive sleep apnea (adult) (pediatric): Secondary | ICD-10-CM | POA: Diagnosis not present

## 2024-08-07 DIAGNOSIS — F321 Major depressive disorder, single episode, moderate: Secondary | ICD-10-CM | POA: Diagnosis not present

## 2024-08-13 DIAGNOSIS — F321 Major depressive disorder, single episode, moderate: Secondary | ICD-10-CM | POA: Diagnosis not present

## 2024-08-18 DIAGNOSIS — G4733 Obstructive sleep apnea (adult) (pediatric): Secondary | ICD-10-CM | POA: Diagnosis not present

## 2024-08-19 DIAGNOSIS — F321 Major depressive disorder, single episode, moderate: Secondary | ICD-10-CM | POA: Diagnosis not present

## 2024-08-28 DIAGNOSIS — F321 Major depressive disorder, single episode, moderate: Secondary | ICD-10-CM | POA: Diagnosis not present

## 2024-09-04 DIAGNOSIS — F321 Major depressive disorder, single episode, moderate: Secondary | ICD-10-CM | POA: Diagnosis not present

## 2024-09-10 DIAGNOSIS — F321 Major depressive disorder, single episode, moderate: Secondary | ICD-10-CM | POA: Diagnosis not present
